# Patient Record
Sex: Male | Born: 1995 | Race: White | Hispanic: No | Marital: Single | Smoking: Never smoker
Health system: Southern US, Community
[De-identification: ages and names within clinical notes are randomized; demographics above are authoritative.]

---
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
## 2014-12-27 ENCOUNTER — Ambulatory Visit: Payer: BLUE CROSS/BLUE SHIELD | Admitting: Urology

## 2017-01-22 ENCOUNTER — Encounter: Payer: Self-pay | Admitting: Emergency Medicine

## 2017-01-22 ENCOUNTER — Emergency Department: Payer: BLUE CROSS/BLUE SHIELD

## 2017-01-22 ENCOUNTER — Emergency Department
Admission: EM | Admit: 2017-01-22 | Discharge: 2017-01-23 | Disposition: A | Discharge status: Home / Self Care | Payer: BLUE CROSS/BLUE SHIELD | Attending: Emergency Medicine | Admitting: Emergency Medicine

## 2017-01-22 DIAGNOSIS — R161 Splenomegaly, not elsewhere classified: Secondary | ICD-10-CM | POA: Insufficient documentation

## 2017-01-22 DIAGNOSIS — L03818 Cellulitis of other sites: Secondary | ICD-10-CM

## 2017-01-22 DIAGNOSIS — L02214 Cutaneous abscess of groin: Secondary | ICD-10-CM | POA: Diagnosis present

## 2017-01-22 DIAGNOSIS — L03314 Cellulitis of groin: Secondary | ICD-10-CM | POA: Insufficient documentation

## 2017-01-22 DIAGNOSIS — R509 Fever, unspecified: Secondary | ICD-10-CM | POA: Diagnosis not present

## 2017-01-22 DIAGNOSIS — N433 Hydrocele, unspecified: Secondary | ICD-10-CM | POA: Diagnosis not present

## 2017-01-22 DIAGNOSIS — L0291 Cutaneous abscess, unspecified: Secondary | ICD-10-CM

## 2017-01-22 LAB — CBC WITH DIFFERENTIAL/PLATELET
BASOS ABS: 0.1 10*3/uL (ref 0–0.1)
BASOS PCT: 1 %
EOS ABS: 0 10*3/uL (ref 0–0.7)
Eosinophils Relative: 1 %
HCT: 34.3 % — ABNORMAL LOW (ref 40.0–52.0)
HEMOGLOBIN: 11.8 g/dL — AB (ref 13.0–18.0)
Lymphocytes Relative: 10 %
Lymphs Abs: 0.9 10*3/uL — ABNORMAL LOW (ref 1.0–3.6)
MCH: 25.9 pg — ABNORMAL LOW (ref 26.0–34.0)
MCHC: 34.3 g/dL (ref 32.0–36.0)
MCV: 75.5 fL — ABNORMAL LOW (ref 80.0–100.0)
MONOS PCT: 9 %
Monocytes Absolute: 0.8 10*3/uL (ref 0.2–1.0)
Neutro Abs: 6.9 10*3/uL — ABNORMAL HIGH (ref 1.4–6.5)
Neutrophils Relative %: 79 %
Platelets: 201 10*3/uL (ref 150–440)
RBC: 4.55 MIL/uL (ref 4.40–5.90)
RDW: 14.9 % — ABNORMAL HIGH (ref 11.5–14.5)
WBC: 8.6 10*3/uL (ref 3.8–10.6)

## 2017-01-22 LAB — URINALYSIS, COMPLETE (UACMP) WITH MICROSCOPIC
BILIRUBIN URINE: NEGATIVE
Bacteria, UA: NONE SEEN
Glucose, UA: NEGATIVE mg/dL
Hgb urine dipstick: NEGATIVE
KETONES UR: 5 mg/dL — AB
LEUKOCYTES UA: NEGATIVE
NITRITE: NEGATIVE
PH: 5 (ref 5.0–8.0)
Protein, ur: NEGATIVE mg/dL
Specific Gravity, Urine: 1.045 — ABNORMAL HIGH (ref 1.005–1.030)
Squamous Epithelial / LPF: NONE SEEN

## 2017-01-22 LAB — BASIC METABOLIC PANEL
Anion gap: 10 (ref 5–15)
BUN: 12 mg/dL (ref 6–20)
CO2: 25 mmol/L (ref 22–32)
CREATININE: 0.65 mg/dL (ref 0.61–1.24)
Calcium: 8.6 mg/dL — ABNORMAL LOW (ref 8.9–10.3)
Chloride: 100 mmol/L — ABNORMAL LOW (ref 101–111)
GFR calc non Af Amer: >60 mL/min (ref >60)
Glucose, Bld: 117 mg/dL — ABNORMAL HIGH (ref 65–99)
Potassium: 3.7 mmol/L (ref 3.5–5.1)
SODIUM: 135 mmol/L (ref 135–145)

## 2017-01-22 LAB — LACTIC ACID, PLASMA: Lactic Acid, Venous: 1 mmol/L (ref 0.5–1.9)

## 2017-01-22 MED ORDER — ONDANSETRON HCL 4 MG/2ML IJ SOLN
4.0000 mg | Freq: Once | INTRAMUSCULAR | Status: AC
  Administered 2017-01-22: 4 mg via INTRAVENOUS
  Filled 2017-01-22: qty 2

## 2017-01-22 MED ORDER — CLINDAMYCIN PHOSPHATE 600 MG/50ML IV SOLN
600.0000 mg | Freq: Once | INTRAVENOUS | Status: AC
  Administered 2017-01-22: 600 mg via INTRAVENOUS
  Filled 2017-01-22: qty 50

## 2017-01-22 MED ORDER — OXYCODONE-ACETAMINOPHEN 5-325 MG PO TABS: 1.0000 | ORAL_TABLET | Freq: Four times a day (QID) | ORAL | 0 refills | Status: DC | PRN

## 2017-01-22 MED ORDER — SODIUM CHLORIDE 0.9 % IV BOLUS (SEPSIS)
1000.0000 mL | Freq: Once | INTRAVENOUS | Status: AC
  Administered 2017-01-22: 1000 mL via INTRAVENOUS

## 2017-01-22 MED ORDER — IOPAMIDOL (ISOVUE-300) INJECTION 61%
125.0000 mL | Freq: Once | INTRAVENOUS | Status: AC | PRN
  Administered 2017-01-22: 125 mL via INTRAVENOUS
  Filled 2017-01-22: qty 150

## 2017-01-22 MED ORDER — AMOXICILLIN-POT CLAVULANATE 875-125 MG PO TABS: 1.0000 | ORAL_TABLET | Freq: Two times a day (BID) | ORAL | 0 refills | Status: DC

## 2017-01-22 MED ORDER — MORPHINE SULFATE (PF) 2 MG/ML IV SOLN
2.0000 mg | INTRAVENOUS | Status: DC | PRN
  Administered 2017-01-22 (×2): 2 mg via INTRAVENOUS
  Filled 2017-01-22 (×2): qty 1

## 2017-01-22 NOTE — ED Notes (Signed)
Attempted IV insertion. Unsuccessful. Family states pt has always been a hard stick since childhood.

## 2017-01-22 NOTE — ED Triage Notes (Signed)
Patient presents to the ED with abscess to groin area since Sunday.  Patient reports fever at home that is relieved with tylenol.  Patient is in no obvious distress at this time.  Patient also has wound to middle finger of left hand.  Patient states area was a similar abscess that he drained.

## 2017-01-22 NOTE — ED Notes (Signed)
Pt has pubic swelling, greater on L. Scrotal swelling as well. Tender to touch. Pt denies having difficulty urinating. Pt states pubic swelling began Saturday and swelling has progressed downward and increased in size.

## 2017-01-22 NOTE — ED Notes (Signed)
Pt taken to CT via stretcher, family remains at bedside.  

## 2017-01-22 NOTE — Discharge Instructions (Signed)
Your exam, labs, and CT scan revealed a small, developing abscess. You also have some benign, reactive swelling in the scrotum (hydrocele). You should take the antibiotic as directed. Take the pain medicine as needed. Rest with the legs elevated, and your scrotum propped up on a rolled towel. Call Dr. Jasmine AweHerrick's office in the morning to confirm a time, and the office location. Return to the ED immediately for any worsening symptoms in the interim.

## 2017-01-22 NOTE — ED Provider Notes (Signed)
[facility] Emergency Department Provider Note ____________________________________________  Time seen: 1939  I have reviewed the triage vital signs and the nursing notes.  HISTORY  Chief Complaint  Abscess  History as reported to M. Whitehurst, PA-S ([place])  HPI Greg Valdez is a 21 y.o. male presents to the ED for evaluation of multiple local abscess to the dorsal fingers; but primarily a large abscess to the left groin and scrotum. He noticed it on Sunday, four days prior to arrival. He gives a history of recurrent abscess to the skin; and give a 2-year history of a scrotal abscess requiring surgical incision and drainage. He notes intermittent, subjective fevers and anorexia today. He reports a couple episodes of vomiting today. His mom admits to giving him 6 doses of Bactrim at onset.   History reviewed. No pertinent past medical history.  There are no active problems to display for this patient.  History reviewed. No pertinent surgical history.  Prior to Admission medications   Medication Sig Start Date End Date Taking? Authorizing Provider  amoxicillin-clavulanate (AUGMENTIN) 875-125 MG tablet Take 1 tablet by mouth 2 (two) times daily. 01/22/17   Fruma Africa, Charlesetta IvoryJenise V Bacon, PA-C  oxyCODONE-acetaminophen (ROXICET) 5-325 MG tablet Take 1 tablet by mouth every 6 (six) hours as needed for moderate pain or severe pain. 01/22/17   Dimitra Woodstock, Charlesetta IvoryJenise V Bacon, PA-C    Allergies Patient has no known allergies.  No family history on file.  Social History Social History  Substance Use Topics  . Smoking status: Never Smoker  . Smokeless tobacco: Never Used  . Alcohol use Yes     Comment: occasionally    Review of Systems  Constitutional: Positive for fever. Cardiovascular: Negative for chest pain. Respiratory: Negative for shortness of breath. Gastrointestinal: Negative for abdominal pain and diarrhea. Reports anorexia and vomiting today. Genitourinary:  Negative for dysuria. Scrotal abscess as above. Skin: Negative for rash. Finger absceses as above. Neurological: Negative for headaches, focal weakness or numbness. ____________________________________________  PHYSICAL EXAM:  VITAL SIGNS: ED Triage Vitals [01/22/17 1832]  Enc Vitals Group     BP 124/67     Pulse Rate 98     Resp 18     Temp 99.9 F (37.7 C)     Temp Source Oral     SpO2 100 %     Weight 230 lb (104.3 kg)     Height 5\' 5"  (1.651 m)     Head Circumference      Peak Flow      Pain Score 10     Pain Loc      Pain Edu?      Excl. in GC?     Constitutional: Alert and oriented. Well appearing and in no distress. Head: Normocephalic and atraumatic. Eyes: Conjunctivae are normal. Normal extraocular movements Cardiovascular: Normal rate, regular rhythm. Normal distal pulses. Respiratory: Normal respiratory effort. No wheezes/rales/rhonchi. Gastrointestinal: Soft and nontender. No distention. GU: Normal circumcised penis. Large bilateral, symmetric edema of the scrotum. Firmness, induration and erythema extending from the left inguinal canal, across the pubis, to the right side of the pubis. There was a single, superficial ulceration, measuring a 0.3 cm, without pointing, fluctuance, or spontaneous/expressive drainage. No penile discharge.   Musculoskeletal: Nontender with normal range of motion in all extremities.  Neurologic:  Normal speech and language. No gross focal neurologic deficits are appreciated. Skin:  Skin is warm, dry and intact. No rash noted. ____________________________________________   LABS (pertinent positives/negatives)  Labs Reviewed  CBC WITH DIFFERENTIAL/PLATELET - Abnormal; Notable for the following:       Result Value   Hemoglobin 11.8 (*)    HCT 34.3 (*)    MCV 75.5 (*)    MCH 25.9 (*)    RDW 14.9 (*)    Neutro Abs 6.9 (*)    Lymphs Abs 0.9 (*)    All other components within normal limits  BASIC METABOLIC PANEL - Abnormal; Notable  for the following:    Chloride 100 (*)    Glucose, Bld 117 (*)    Calcium 8.6 (*)    All other components within normal limits  URINALYSIS, COMPLETE (UACMP) WITH MICROSCOPIC - Abnormal; Notable for the following:    Color, Urine YELLOW (*)    APPearance CLEAR (*)    Specific Gravity, Urine 1.045 (*)    Ketones, ur 5 (*)    All other components within normal limits  LACTIC ACID, PLASMA  ____________________________________________   RADIOLOGY  CTAbd/Pelvis  IMPRESSION: 1. Large bilateral scrotal hydroceles. Marked skin thickening and extensive edema and inflammatory process involving the inter upper left thigh, left scrotal region, and left greater than right groin suspect for cellulitis. More focal density in the left inguinal/upper scrotal region measuring 4.2 cm may represent flight min (phlegmon) or developing abscess. No evidence for soft tissue emphysema. 2. Multiple enlarged inguinal and iliac lymph nodes, these are likely reactive. 3. Enlarged spleen 4. Possible patchy hypoenhancement of the right greater than left renal cortex, suggest correlation with urinalysis to exclude pyelonephritis. ____________________________________________  PROCEDURES  NS 1000 bolus IVP Morphine 2 mg IVP x 2 Zofran 4 mg IVP Clindamycin 600 mg IVPB ____________________________________________  INITIAL IMPRESSION / ASSESSMENT AND PLAN / ED COURSE  ----------------------------------------- 11:00 PM on 01/22/2017 ----------------------------------------- Spoke with Dr. Marlou Porch (urology) about the case. Reviewed presentation, labs, CT and clinical response. He suggested outpatient management and close office follow-up in the morning. He also would be available to consult if patient seemed more inclined to come in for IV Abx and pain control.   Discussed treatment plan options with the patient and his parents, with his consent. He decided to follow-up with urology in the morning. He will  take Augmentin and Percocet for pain relief. He verbalized understanding of the treatment plan, and return precautions.  ____________________________________________  FINAL CLINICAL IMPRESSION(S) / ED DIAGNOSES  Final diagnoses:  Abscess  Hydrocele, bilateral  Cellulitis of other specified site      Lissa Hoard, PA-C 01/23/17 0011    Rockne Menghini, MD 01/27/17 2137

## 2017-01-22 NOTE — ED Notes (Signed)
Pt given urinal and informed of need for urine sample. States he doesn't have to urinate right now.

## 2017-01-23 ENCOUNTER — Ambulatory Visit (INDEPENDENT_AMBULATORY_CARE_PROVIDER_SITE_OTHER): Payer: BLUE CROSS/BLUE SHIELD | Admitting: Urology

## 2017-01-23 ENCOUNTER — Encounter: Payer: Self-pay | Admitting: Urology

## 2017-01-23 VITALS — BP 107/67 | HR 93 | Ht 65.0 in | Wt 235.6 lb

## 2017-01-23 DIAGNOSIS — N492 Inflammatory disorders of scrotum: Secondary | ICD-10-CM

## 2017-01-23 DIAGNOSIS — L03314 Cellulitis of groin: Secondary | ICD-10-CM | POA: Diagnosis not present

## 2017-01-23 MED ORDER — ACETAMINOPHEN 500 MG PO TABS
1000.0000 mg | ORAL_TABLET | Freq: Once | ORAL | Status: AC
  Administered 2017-01-23: 1000 mg via ORAL
  Filled 2017-01-23: qty 2

## 2017-01-23 MED ORDER — CLINDAMYCIN HCL 300 MG PO CAPS: 600.0000 mg | ORAL_CAPSULE | Freq: Three times a day (TID) | ORAL | 0 refills | Status: DC

## 2017-01-23 NOTE — ED Notes (Signed)

## 2017-01-23 NOTE — Progress Notes (Signed)
01/23/2017 2:13 PM   Greg Valdez 12/14/1995 [id number]  Referring provider: [facility] [address] [place], [place] [id number]  CC: Scrotal cellulitis  HPI: The patient is a 21 year old gentleman with history of scrotal abscess requiring incision and drainage who presents today for ER follow-up after being seen last night for groin swelling.  He was seen in the ER for this last night. This started approximately 5 days ago. Is gotten increasingly worse. He has had bilateral scrotal swelling and pain. It's been worse on the left. There is gotten increasingly worse. He has had fevers greater than 101.  He has noted no drainage from this area. He was seen in the emergency department for this last night. The CT scan did show large bilateral hydroceles as well as marked skin thickening and extensive edema and inflammatory process involving the inner upper thigh, left scrotal region, and left greater than right groin suspect for cellulitis. There is a focal density in the left inguinal/scrotal region measuring 4.2 cm which may be indicated in early abscess. He had no definable abscess at that time. He was given a dose of clindamycin and discharged home on Augmentin. He has been afebrile today.   PMH: History reviewed. No pertinent past medical history.  Surgical History: History reviewed. No pertinent surgical history.  Home Medications:  Allergies as of 01/23/2017   No Known Allergies     Medication List       Accurate as of 01/23/17  2:13 PM. Always use your most recent med list.          amoxicillin-clavulanate 875-125 MG tablet Commonly known as:  AUGMENTIN Take 1 tablet by mouth 2 (two) times daily.   clindamycin 300 MG capsule Commonly known as:  CLEOCIN Take 2 capsules (600 mg total) by mouth 3 (three) times daily.   oxyCODONE-acetaminophen 5-325 MG tablet Commonly known as:  ROXICET Take 1 tablet by mouth every 6 (six) hours as needed for moderate  pain or severe pain.            Discharge Care Instructions        Start     Ordered   01/23/17 0000  clindamycin (CLEOCIN) 300 MG capsule  3 times daily     01/23/17 1346      Allergies: No Known Allergies  Family History: Family History  Problem Relation Age of Onset  . Prostate cancer Neg Hx   . Bladder Cancer Neg Hx   . Kidney cancer Neg Hx     Social History:  reports that he has never smoked. He has never used smokeless tobacco. He reports that he drinks alcohol. His drug history is not on file.  ROS: UROLOGY Frequent Urination?: No Hard to postpone urination?: No Burning/pain with urination?: No Get up at night to urinate?: Yes Leakage of urine?: No Urine stream starts and stops?: No Trouble starting stream?: Yes Do you have to strain to urinate?: No Blood in urine?: No Urinary tract infection?: No Sexually transmitted disease?: No Injury to kidneys or bladder?: No Painful intercourse?: No Weak stream?: No Erection problems?: No Penile pain?: No  Gastrointestinal Nausea?: Yes Vomiting?: Yes Indigestion/heartburn?: No Diarrhea?: No Constipation?: No  Constitutional Fever: Yes Night sweats?: No Weight loss?: No Fatigue?: No  Skin Skin rash/lesions?: Yes Itching?: No  Eyes Blurred vision?: No Double vision?: No  Ears/Nose/Throat Sore throat?: No Sinus problems?: No  Hematologic/Lymphatic Swollen glands?: No Easy bruising?: No  Cardiovascular Leg swelling?: No  Chest pain?: No  Respiratory Cough?: No Shortness of breath?: No  Endocrine Excessive thirst?: No  Musculoskeletal Back pain?: No Joint pain?: No  Neurological Headaches?: No Dizziness?: No  Psychologic Depression?: No Anxiety?: No  Physical Exam: BP 107/67 (BP Location: Left Arm, Patient Position: Sitting, Cuff Size: Normal)   Pulse 93   Ht  (1.651 m)   Wt 235 lb 9.6 oz (106.9 kg)   BMI 39.21 kg/m   Constitutional:  Alert and oriented, No acute  distress. HEENT: [place] AT, moist mucus membranes.  Trachea midline, no masses. Cardiovascular: No clubbing, cyanosis, or edema. Respiratory: Normal respiratory effort, no increased work of breathing. GI: Abdomen is soft, nontender, nondistended, no abdominal masses GU: No CVA tenderness. Normal phallus. Diffuse bilateral scrotal edema with hydroceles. He is also edematous within his left greater than right inguinal canals. There is no areas of drainage. In the left upper scrotum there is an approximate 2 cm area of fluctuance that is not draining. There is no crepitus. There are no other areas of fluctuance. Skin: No rashes, bruises or suspicious lesions. Lymph: No cervical or inguinal adenopathy. Neurologic: Grossly intact, no focal deficits, moving all 4 extremities. Psychiatric: Normal mood and affect.  Laboratory Data: Lab Results  Component Value Date   WBC 8.6 01/22/2017   HGB 11.8 (L) 01/22/2017   HCT 34.3 (L) 01/22/2017   MCV 75.5 (L) 01/22/2017   PLT 201 01/22/2017    Lab Results  Component Value Date   CREATININE 0.65 01/22/2017    No results found for: PSA  No results found for: TESTOSTERONE  No results found for: HGBA1C  Urinalysis    Component Value Date/Time   COLORURINE YELLOW (A) 01/22/2017 2244   APPEARANCEUR CLEAR (A) 01/22/2017 2244   LABSPEC 1.045 (H) 01/22/2017 2244   PHURINE 5.0 01/22/2017 2244   GLUCOSEU NEGATIVE 01/22/2017 2244   HGBUR NEGATIVE 01/22/2017 2244   BILIRUBINUR NEGATIVE 01/22/2017 2244   KETONESUR 5 (A) 01/22/2017 2244   PROTEINUR NEGATIVE 01/22/2017 2244   NITRITE NEGATIVE 01/22/2017 2244   LEUKOCYTESUR NEGATIVE 01/22/2017 2244    Pertinent Imaging: CT images personally reviewed as above  Procedure: The patient was prepped and draped in the usual sterile fashion. 8 cc of lidocaine was injected without epinephrine into the fluctuant area in the left lateral upper scrotum. A 2 cm incision was made. There is immediate drainage of  purulent discharge which was sent for cultures. All purulent material was expressed. The wound was probed to break up any loculations until there was no more purulent drainage. The wound was then packed with iodoform and gauze.  Assessment & Plan:    1. Scrotal abscess 2. Scrotal cellulitis We will add clindamycin to the patient's antibiotic regimen. Will follow-up in the patient's wound cultures. His mother is a nurse has been instructed to remove approximately 5 cm of the iodoform gauze daily. He will follow-up on Monday in our office for a wound check. He has mother given strict return precautions if his condition worsens over the weekend.  Return in 3 days (on 01/26/2017).  Hildred Laser, MD  [facility] [address], [address] [place], [place] [address] [phone]

## 2017-01-23 NOTE — Addendum Note (Signed)
Addended by: Honor LohGARRISON, CARRIE M on: 01/23/2017 02:28 PM   Modules accepted: Orders

## 2017-01-26 ENCOUNTER — Ambulatory Visit (INDEPENDENT_AMBULATORY_CARE_PROVIDER_SITE_OTHER): Payer: BLUE CROSS/BLUE SHIELD | Admitting: Urology

## 2017-01-26 ENCOUNTER — Encounter: Payer: Self-pay | Admitting: Urology

## 2017-01-26 VITALS — BP 107/70 | HR 72 | Ht 65.0 in | Wt 232.0 lb

## 2017-01-26 DIAGNOSIS — N492 Inflammatory disorders of scrotum: Secondary | ICD-10-CM

## 2017-01-26 MED ORDER — OXYCODONE-ACETAMINOPHEN 5-325 MG PO TABS: 1.0000 | ORAL_TABLET | Freq: Four times a day (QID) | ORAL | 0 refills | Status: DC | PRN

## 2017-01-26 NOTE — Progress Notes (Signed)
The patient is seen today for follow-up of a scrotal abscess. Over the past week his mother has been slowly removing the packing and cutting off a little bit more each day. He denies any ongoing fevers. Has not had any voiding symptoms. His pain is improving, but he still has some tenderness around the area of his incision. His scrotal edema has improved significantly. However, he continues to have some swelling and induration in that area.  Current Outpatient Prescriptions on File Prior to Visit  Medication Sig Dispense Refill  . amoxicillin-clavulanate (AUGMENTIN) 875-125 MG tablet Take 1 tablet by mouth 2 (two) times daily. 20 tablet 0  . clindamycin (CLEOCIN) 300 MG capsule Take 2 capsules (600 mg total) by mouth 3 (three) times daily. 60 capsule 0   No current facility-administered medications on file prior to visit.    History reviewed. No pertinent past medical history. Vitals:   01/26/17 1524  BP: 107/70  Pulse: 72  Weight: 105.2 kg (232 lb)  Height: 5\' 5"  (1.651 m)   NAD 1cm Opening in the left hemiscrotum on the dependent area with packing emanating up and to the left. There is no tenderness around the area of the incision or in the groin. There is no erythema. There continues to be some scrotal edema.  Procedure: The packing that was previously placed was removed. I then took a pickup and removed several large clots of fibrinous debris. I then washed the area out again with saline and gauze. I then repacked the area with 2 x 2 Kerlix dressing.  Impression/recommendation: The patient has a left hemiscrotal/inguinal abscess has been drained and is healing well. The patient will continue his antibiotics until his completed them. He should now be changing his dressing once daily. We'll plan to have him follow up in one week for reevaluation/wound check.

## 2017-01-29 LAB — WOUND CULTURE

## 2017-01-30 ENCOUNTER — Telehealth: Payer: Self-pay

## 2017-01-30 MED ORDER — SULFAMETHOXAZOLE-TRIMETHOPRIM 800-160 MG PO TABS: 1.0000 | ORAL_TABLET | Freq: Two times a day (BID) | ORAL | 0 refills | Status: DC

## 2017-01-30 NOTE — Telephone Encounter (Signed)
Patient notified on vmail, abx sent

## 2017-01-30 NOTE — Telephone Encounter (Signed)
-----   Message from Hildred Laser, MD sent at 01/29/2017  6:41 PM EDT ----- Please let patient/mom know that his wound culture finally came back. He has MRSA growing (Mother is a Engineer, civil (consulting), so she will know what this means). It is recent to clindamycin which I started him on.  Fortunately, draining the abscess was the most important thing to help him. He should stop the abx he is on and start 1 tab PO Bactrim DS BID for 10 days to clean up an residual infection. Thanks, BB   ----- Message ----- From: Lissa Hoard, CMA Sent: 01/29/2017  11:42 AM To: Hildred Laser, MD    ----- Message ----- From: Interface, [facility] Lab Results In Sent: 01/25/2017   7:38 AM To: Jennette Kettle Clinical

## 2017-02-02 ENCOUNTER — Ambulatory Visit: Payer: BLUE CROSS/BLUE SHIELD | Admitting: Urology

## 2017-02-02 ENCOUNTER — Encounter: Payer: Self-pay | Admitting: Urology

## 2017-02-02 VITALS — BP 116/75 | HR 64 | Ht 65.0 in | Wt 231.9 lb

## 2017-02-02 DIAGNOSIS — N492 Inflammatory disorders of scrotum: Secondary | ICD-10-CM | POA: Insufficient documentation

## 2017-02-02 NOTE — Progress Notes (Unsigned)
02/02/2017 8:31 AM   Greg Valdez Sep 22, 1995 [id number]  Referring provider: [facility] [address] [place], [place] [address]  CC: Fu scrotal abscess  HPI:  1 - Scrotal Abscess - s/p I+D in ofice by  [facility] 01/23/17 for 4cm abscess. CX MRSA res to clinda and placed on Bactrim. Improving at wound check 9/10 and continue packing. Wound check 9/17 very mild induration, completely resolved fluid collection.   Today "Greg Valdez" is seen for wound check for scrotal abscess. He is on Bactirm now according to CX's.    PMH: No past medical history on file.  Surgical History: No past surgical history on file.  Home Medications:  Allergies as of 02/02/2017   No Known Allergies     Medication List       Accurate as of 02/02/17  8:31 AM. Always use your most recent med list.          amoxicillin-clavulanate 875-125 MG tablet Commonly known as:  AUGMENTIN Take 1 tablet by mouth 2 (two) times daily.   clindamycin 300 MG capsule Commonly known as:  CLEOCIN Take 2 capsules (600 mg total) by mouth 3 (three) times daily.   oxyCODONE-acetaminophen 5-325 MG tablet Commonly known as:  ROXICET Take 1 tablet by mouth every 6 (six) hours as needed for moderate pain or severe pain.   sulfamethoxazole-trimethoprim 800-160 MG tablet Commonly known as:  BACTRIM DS,SEPTRA DS Take 1 tablet by mouth every 12 (twelve) hours.       Allergies: No Known Allergies  Family History: Family History  Problem Relation Age of Onset  . Prostate cancer Neg Hx   . Bladder Cancer Neg Hx   . Kidney cancer Neg Hx     Social History:  reports that he has never smoked. He has never used smokeless tobacco. He reports that he drinks alcohol. His drug history is not on file.    Review of Systems  Gastrointestinal (upper)  : Negative for upper GI symptoms  Gastrointestinal (lower) : Negative for lower GI symptoms  Constitutional : Negative for symptoms  Skin: Negative  for skin symptoms  Eyes: Negative for eye symptoms  Ear/Nose/Throat : Negative for Ear/Nose/Throat symptoms  Hematologic/Lymphatic: Negative for Hematologic/Lymphatic symptoms  Cardiovascular : Negative for cardiovascular symptoms  Respiratory : Negative for respiratory symptoms  Endocrine: Negative for endocrine symptoms  Musculoskeletal: Negative for musculoskeletal symptoms  Neurological: Negative for neurological symptoms  Psychologic: Negative for psychiatric symptoms   Physical Exam: There were no vitals taken for this visit.  Constitutional:  Alert and oriented, No acute distress. HEENT: [place] AT, moist mucus membranes.  Trachea midline, no masses. Cardiovascular: No clubbing, cyanosis, or edema. Respiratory: Normal respiratory effort, no increased work of breathing. GI: Abdomen is soft, nontender, nondistended, no abdominal masses GU: No CVA tenderness. Left groin / upper scrotal mild induration w/o erythema / fluctence. No drainage.  Skin: No rashes, bruises or suspicious lesions. Lymph: No cervical or inguinal adenopathy. Neurologic: Grossly intact, no focal deficits, moving all 4 extremities. Psychiatric: Normal mood and affect.  Laboratory Data: Lab Results  Component Value Date   WBC 8.6 01/22/2017   HGB 11.8 (L) 01/22/2017   HCT 34.3 (L) 01/22/2017   MCV 75.5 (L) 01/22/2017   PLT 201 01/22/2017    Lab Results  Component Value Date   CREATININE 0.65 01/22/2017    No results found for: PSA  No results found for: TESTOSTERONE  No results found for: HGBA1C  Urinalysis  Component Value Date/Time   COLORURINE YELLOW (A) 01/22/2017 2244   APPEARANCEUR CLEAR (A) 01/22/2017 2244   LABSPEC 1.045 (H) 01/22/2017 2244   PHURINE 5.0 01/22/2017 2244   GLUCOSEU NEGATIVE 01/22/2017 2244   HGBUR NEGATIVE 01/22/2017 2244   BILIRUBINUR NEGATIVE 01/22/2017 2244   KETONESUR 5 (A) 01/22/2017 2244   PROTEINUR NEGATIVE 01/22/2017 2244   NITRITE NEGATIVE  01/22/2017 2244   LEUKOCYTESUR NEGATIVE 01/22/2017 2244     Assessment & Plan:    1. Scrotal abscess - resolving clinically, nearly completely so. I do not feel further need for any wound packing. Continue bactrim as RX'd, then topical neosporin PRN until completely mucosalized.   Sebastian Ache, MD  [facility] [address] [place], [place] [phone]

## 2019-02-01 IMAGING — CT CT ABD-PELV W/ CM
2 of 4 series · 15 of 46 positions shown, 17 images · IV contrast (APPLIED)
Comparison: 12/20/2005

CLINICAL DATA: Abscess to the groin area with fever

EXAM:
CT ABDOMEN AND PELVIS WITH CONTRAST
TECHNIQUE: Multidetector CT imaging of the abdomen and pelvis was performed
using the standard protocol following bolus administration of
intravenous contrast.
CONTRAST:  125mL [id number] IOPAMIDOL ([id number]) INJECTION 61%

[Series 2: routine abd/pel with · axial · 0.91mm/px · z∈[-733,-183]mm · 12 of 120 slices shown, 14 images]
[im 5/120  soft-tissue]
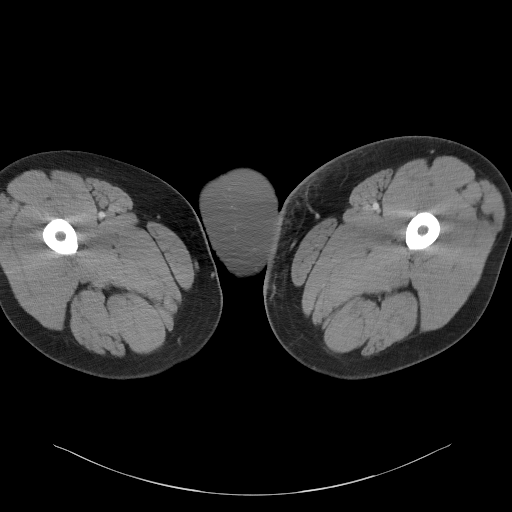
[im 5/120  bone]
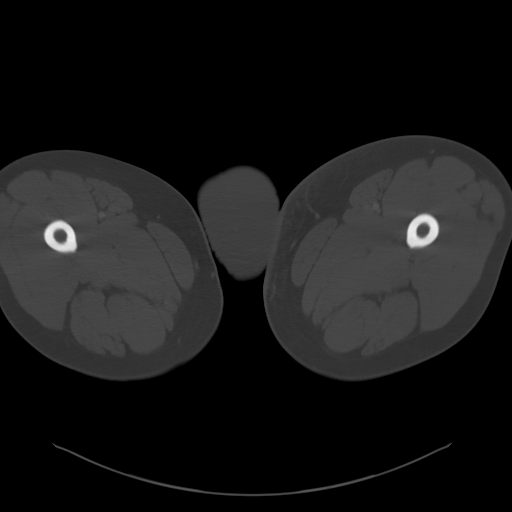
[im 15/120  soft-tissue]
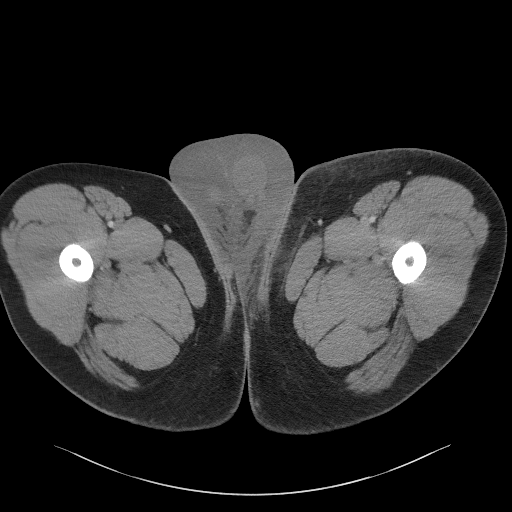
[im 25/120  soft-tissue]
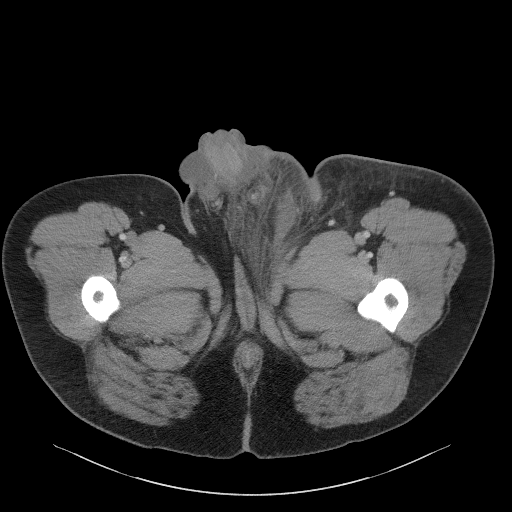
[im 35/120  soft-tissue]
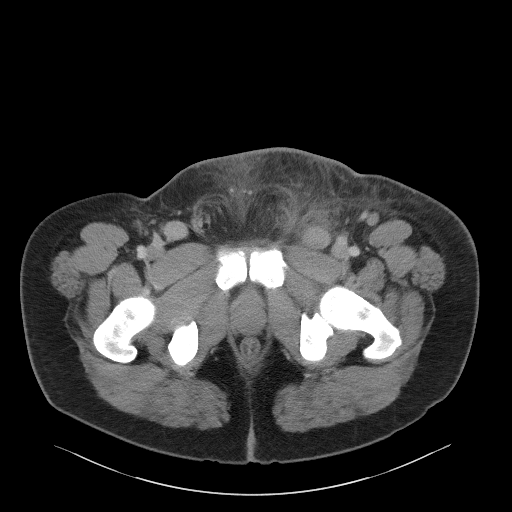
[im 45/120  soft-tissue]
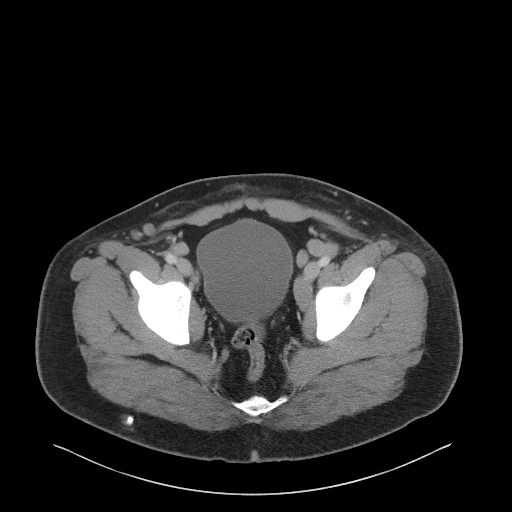
[im 55/120  soft-tissue]
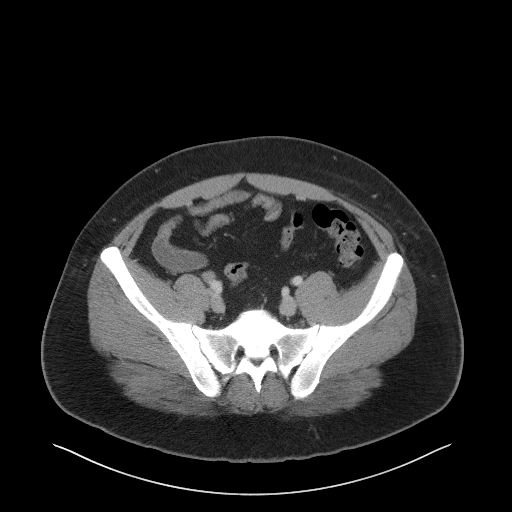
[im 65/120  soft-tissue]
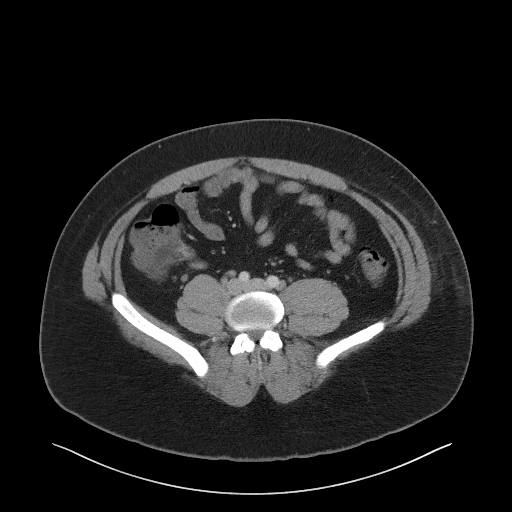
[im 75/120  soft-tissue]
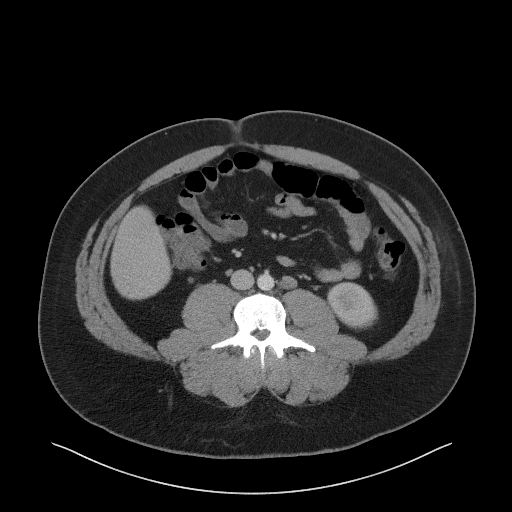
[im 85/120  soft-tissue]
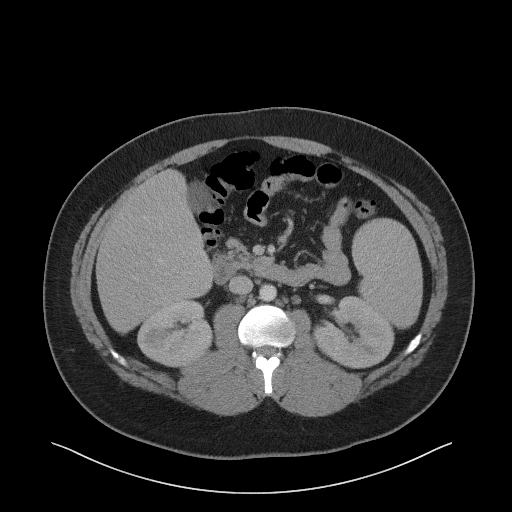
[im 85/120  bone]
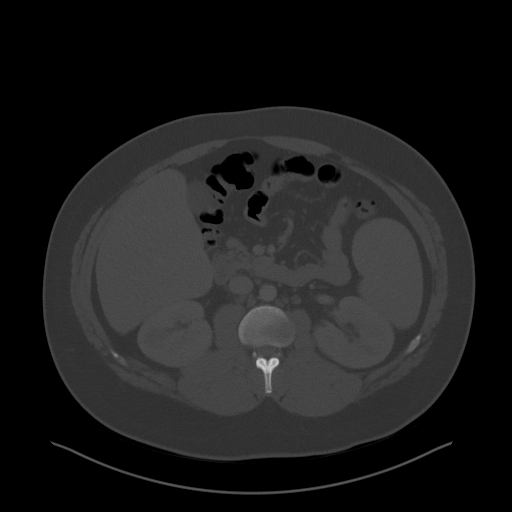
[im 95/120  soft-tissue]
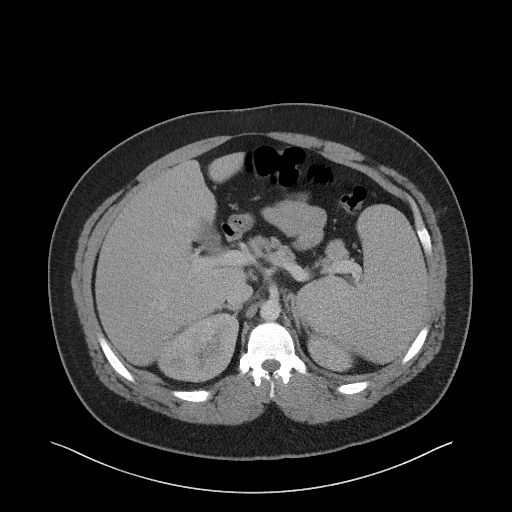
[im 105/120  soft-tissue]
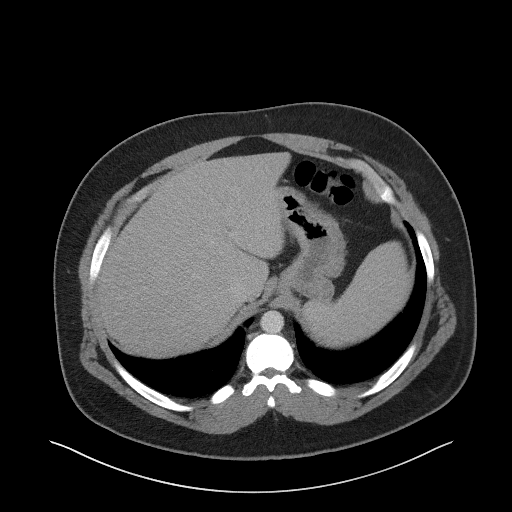
[im 115/120  soft-tissue]
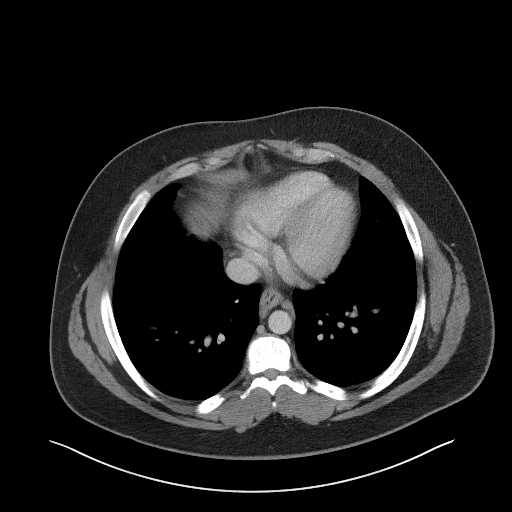

[Series 5: coronal st · coronal · 0.81mm/px · 3 of 102 slices shown]
[im 34/102  soft-tissue]
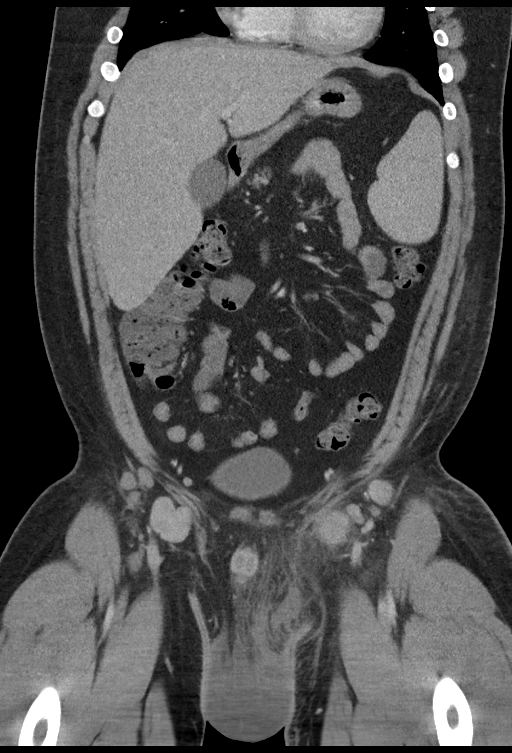
[im 45/102  soft-tissue]
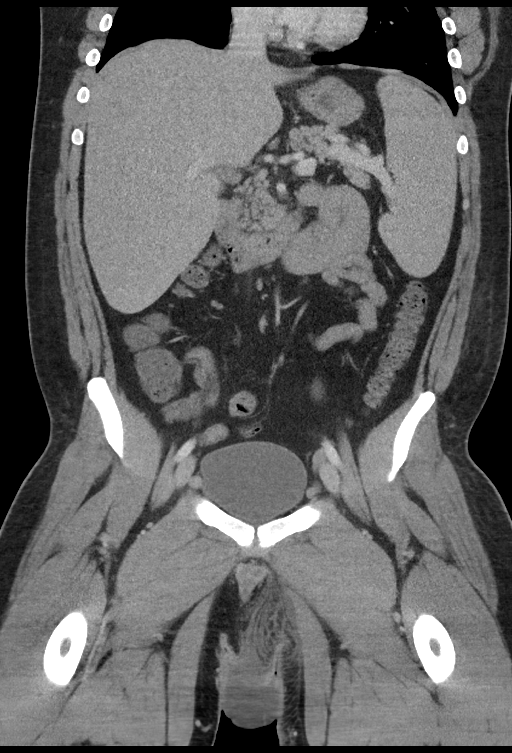
[im 57/102  soft-tissue]
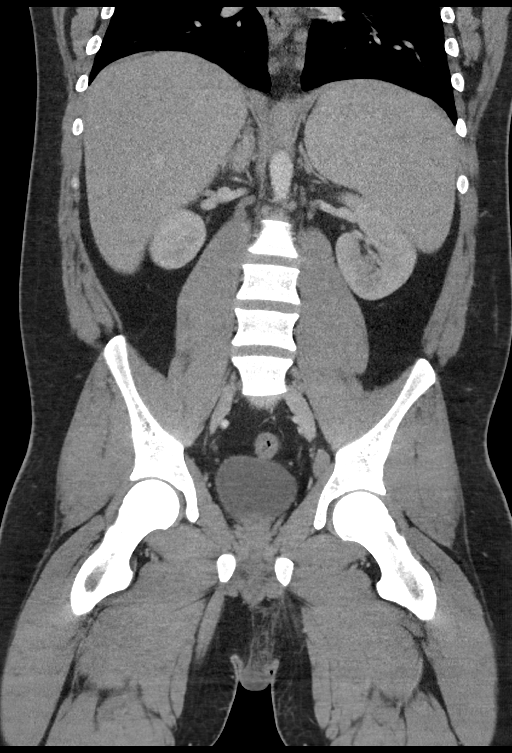

[15 of 46 positions shown; findings below may reference images not displayed]

FINDINGS: Lower chest: No acute abnormality.

Hepatobiliary: No focal liver abnormality is seen. No gallstones,
gallbladder wall thickening, or biliary dilatation.

Pancreas: Unremarkable. No pancreatic ductal dilatation or
surrounding inflammatory changes.

Spleen: Slightly enlarged, measuring 15.4 cm.

Adrenals/Urinary Tract: Adrenal glands are within normal limits.
Negative for hydronephrosis. Questionable patchy hypodensities
within the right greater than left kidneys. Bladder normal

Stomach/Bowel: Stomach is within normal limits. Appendix appears
normal. No evidence of bowel wall thickening, distention, or
inflammatory changes.

Vascular/Lymphatic: Non aneurysmal aorta. Enlarged left iliac nodes,
measuring up to 11 mm. Multiple enlarged left greater than right
inguinal and external iliac lymph nodes.

Reproductive: Prostate unremarkable.  Large hydroceles.

Other: Negative for free air or free fluid. Skin thickening and
marked edema within the proximal left thigh, left scrotal region and
left groin suspect for a cellulitis. Focal heterogenous density
measuring 4.2 x 1.8 cm within the left inguinal /upper scrotal
region. No soft tissue gas is present.

Musculoskeletal: No acute or suspicious bone lesion.
IMPRESSION: 1. Large bilateral scrotal hydroceles. Marked skin thickening and
extensive edema and inflammatory process involving the inter upper
left thigh, left scrotal region, and left greater than right groin
suspect for cellulitis. More focal density in the left
inguinal/upper scrotal region measuring 4.2 cm may represent flight
min or developing abscess. No evidence for soft tissue emphysema.
2. Multiple enlarged inguinal and iliac lymph nodes, these are
likely reactive.
3. Enlarged spleen
4. Possible patchy hypoenhancement of the right greater than left
renal cortex, suggest correlation with urinalysis to exclude
pyelonephritis.

## 2024-05-16 ENCOUNTER — Encounter: Payer: Self-pay | Admitting: Internal Medicine

## 2024-05-16 ENCOUNTER — Ambulatory Visit: Admitting: Internal Medicine

## 2024-05-16 VITALS — BP 118/74 | HR 66 | Temp 98.7°F | Ht 65.0 in | Wt 202.4 lb

## 2024-05-16 DIAGNOSIS — E6609 Other obesity due to excess calories: Secondary | ICD-10-CM | POA: Insufficient documentation

## 2024-05-16 DIAGNOSIS — E66811 Obesity, class 1: Secondary | ICD-10-CM

## 2024-05-16 DIAGNOSIS — D801 Nonfamilial hypogammaglobulinemia: Secondary | ICD-10-CM | POA: Insufficient documentation

## 2024-05-16 DIAGNOSIS — Q02 Microcephaly: Secondary | ICD-10-CM

## 2024-05-16 DIAGNOSIS — J00 Acute nasopharyngitis [common cold]: Secondary | ICD-10-CM

## 2024-05-16 DIAGNOSIS — J029 Acute pharyngitis, unspecified: Secondary | ICD-10-CM

## 2024-05-16 DIAGNOSIS — Z6833 Body mass index (BMI) 33.0-33.9, adult: Secondary | ICD-10-CM

## 2024-05-16 DIAGNOSIS — L309 Dermatitis, unspecified: Secondary | ICD-10-CM

## 2024-05-16 MED ORDER — PREDNISONE 10 MG PO TABS: ORAL_TABLET | ORAL | 0 refills | Status: AC

## 2024-05-16 MED ORDER — IMMUNE GLOBULIN (HUMAN) 1 GM/10ML IJ SOLN: 15.0000 g | INTRAMUSCULAR | 0 refills | Status: AC

## 2024-05-16 MED ORDER — BENZONATATE 100 MG PO CAPS: 100.0000 mg | ORAL_CAPSULE | Freq: Three times a day (TID) | ORAL | 0 refills | Status: AC | PRN

## 2024-05-16 MED ORDER — AZITHROMYCIN 250 MG PO TABS: ORAL_TABLET | ORAL | 0 refills | Status: AC

## 2024-05-16 NOTE — Assessment & Plan Note (Signed)
 Encouraged diet and exercise for weight loss ?

## 2024-05-16 NOTE — Patient Instructions (Signed)

## 2024-05-16 NOTE — Progress Notes (Signed)
 "  Subjective:    Patient ID: Greg Valdez, male    DOB: 07-21-1995, 28 y.o.   MRN: [id number]  HPI  Patient presents to the clinic today to establish care and for management of the conditions listed below.  Agammaglobulinemia: Managed with gammagard weekly. He follows with allergy and would like a referral in this area.  He has been experiencing symptoms for about a week, starting halfway through last Monday. Initially, he felt better by Wednesday but then worsened by Wednesday night. He has cold chills, diarrhea, and vomiting, although the vomiting has resolved. Diarrhea persists.  He visited urgent care last Wednesday where he was tested for flu A, flu B, COVID, and RSV, all of which were negative. He was prescribed an albuterol inhaler, which has not been effective. He has also been taking Mucinex.  He reports intermittent chills, slight headaches, sinus pressure, runny nose, nasal congestion, and yellow mucus when blowing his nose. No ear pain but a slight sore throat. He experiences shortness of breath and coughs up yellow mucus.  No recent fever. He attempted to return to work but was advised against it due to his symptoms.   Review of Systems   No past medical history on file.  Current Outpatient Medications  Medication Sig Dispense Refill   amoxicillin -clavulanate (AUGMENTIN ) 875-125 MG tablet Take 1 tablet by mouth 2 (two) times daily. (Patient not taking: Reported on 02/02/2017) 20 tablet 0   clindamycin  (CLEOCIN ) 300 MG capsule Take 2 capsules (600 mg total) by mouth 3 (three) times daily. (Patient not taking: Reported on 02/02/2017) 60 capsule 0   oxyCODONE -acetaminophen  (ROXICET) 5-325 MG tablet Take 1 tablet by mouth every 6 (six) hours as needed for moderate pain or severe pain. (Patient not taking: Reported on 02/02/2017) 20 tablet 0   sulfamethoxazole -trimethoprim  (BACTRIM  DS,SEPTRA  DS) 800-160 MG tablet Take 1 tablet by mouth every 12 (twelve) hours. 14 tablet 0   No  current facility-administered medications for this visit.    Allergies[1]  Family History  Problem Relation Age of Onset   Prostate cancer Neg Hx    Bladder Cancer Neg Hx    Kidney cancer Neg Hx     Social History   Socioeconomic History   Marital status: Single    Spouse name: Not on file   Number of children: Not on file   Years of education: Not on file   Highest education level: Not on file  Occupational History   Not on file  Tobacco Use   Smoking status: Never   Smokeless tobacco: Never  Substance and Sexual Activity   Alcohol use: Yes    Comment: occasionally   Drug use: Not on file   Sexual activity: Not on file  Other Topics Concern   Not on file  Social History Narrative   Not on file   Social Drivers of Health   Tobacco Use: Not on file  Financial Resource Strain: Not on file  Food Insecurity: Not on file  Transportation Needs: Not on file  Physical Activity: Not on file  Stress: Not on file  Social Connections: Not on file  Intimate Partner Violence: Not on file  Depression (EYV7-0): Not on file  Alcohol Screen: Not on file  Housing: Not on file  Utilities: Not on file  Health Literacy: Not on file     Constitutional: Patient reports chills, headache.  Denies fever, malaise, fatigue, or abrupt weight changes.  HEENT: Pt reports sinus pressure, runny nose, nasal congestion, and  sore throat. Denies eye pain, eye redness, ear pain, ringing in the ears, wax buildup, bloody nose. Respiratory: Patient reports cough.  Denies difficulty breathing, shortness of breath.   Cardiovascular: Denies chest pain, chest tightness, palpitations or swelling in the hands or feet.  Gastrointestinal: Patient reports vomiting and diarrhea.  Denies abdominal pain, bloating, constipation, or blood in the stool.  Musculoskeletal: Denies decrease in range of motion, difficulty with gait, muscle pain or joint pain and swelling.  Skin: Denies redness, rashes, lesions or  ulcercations.  Neurological: Denies dizziness, difficulty with memory, difficulty with speech or problems with balance and coordination.    No other specific complaints in a complete review of systems (except as listed in HPI above).      Objective:   Physical Exam  BP 118/74 (BP Location: Left Arm, Patient Position: Sitting, Cuff Size: Large)   Pulse 66   Temp 98.7 F (37.1 C)   Ht 5' 5 (1.651 m)   Wt 202 lb 6.4 oz (91.8 kg)   SpO2 99%   BMI 33.68 kg/m   Wt Readings from Last 3 Encounters:  02/02/17 231 lb 14.4 oz (105.2 kg)  01/26/17 232 lb (105.2 kg)  01/23/17 235 lb 9.6 oz (106.9 kg)    General: Appears his stated age, obese, in NAD. Skin: Warm, dry and intact. No rashes, lesions or ulcerations noted. HEENT: Head: normal shape and size, no sinus tenderness noted; Eyes: sclera white, no icterus, conjunctiva pink, PERRLA and EOMs intact; Ears: Tm's gray and intact, normal light reflex; Nose: mucosa pink and moist, septum midline; Throat/Mouth: Teeth present, mucosa pink and moist, + PND, no exudate, lesions or ulcerations noted.  Neck: No adenopathy noted. Cardiovascular: Normal rate and rhythm. S1,S2 noted.  No murmur, rubs or gallops noted.  Pulmonary/Chest: Normal effort and positive vesicular breath sounds. No respiratory distress. No wheezes, rales or ronchi noted.  Abdomen: Soft and nontender. Normal bowel sounds.  Musculoskeletal: Webbed neck noted.  No difficulty with gait.  Neurological: Alert and oriented. Coordination normal.  Psychiatric: Mood and affect normal. Behavior is normal. Judgment and thought content normal.    BMET    Component Value Date/Time   NA 135 01/22/2017 2017   K 3.7 01/22/2017 2017   CL 100 (L) 01/22/2017 2017   CO2 25 01/22/2017 2017   GLUCOSE 117 (H) 01/22/2017 2017   BUN 12 01/22/2017 2017   CREATININE 0.65 01/22/2017 2017   CALCIUM 8.6 (L) 01/22/2017 2017   GFRNONAA >60 01/22/2017 2017   GFRAA >60 01/22/2017 2017    Lipid  Panel  No results found for: CHOL, TRIG, HDL, CHOLHDL, VLDL, LDLCALC  CBC    Component Value Date/Time   WBC 8.6 01/22/2017 2017   RBC 4.55 01/22/2017 2017   HGB 11.8 (L) 01/22/2017 2017   HCT 34.3 (L) 01/22/2017 2017   PLT 201 01/22/2017 2017   MCV 75.5 (L) 01/22/2017 2017   MCH 25.9 (L) 01/22/2017 2017   MCHC 34.3 01/22/2017 2017   RDW 14.9 (H) 01/22/2017 2017   LYMPHSABS 0.9 (L) 01/22/2017 2017   MONOABS 0.8 01/22/2017 2017   EOSABS 0.0 01/22/2017 2017   BASOSABS 0.1 01/22/2017 2017    Hgb A1C No results found for: HGBA1C          Assessment & Plan:  Assessment and Plan    Urgent care follow-up for acute upper respiratory infection Urgent care notes and labs reviewed. Symptoms suggest viral etiology; antibiotics considered due to symptom duration. - Prescribed azithromycin  250 mg daily for 5 days. - Prescribed prednisone 10 mg taper for 6 days. - Prescribed Tessalon 100 mg cough tablets every 8 hours as needed. - Ordered CBC, CMP, and mono spot blood tests.   RTC in 6 months for your annual exam Angeline Laura, NP     [1] No Known Allergies  "

## 2024-05-16 NOTE — Assessment & Plan Note (Signed)
 Continue gammagard 50 mg weekly as previously prescribed Referral to allergist placed

## 2024-05-17 ENCOUNTER — Ambulatory Visit: Payer: Self-pay | Admitting: Internal Medicine

## 2024-05-17 LAB — CBC
HCT: 43.3 % (ref 39.4–51.1)
Hemoglobin: 13.7 g/dL (ref 13.2–17.1)
MCH: 24.9 pg — ABNORMAL LOW (ref 27.0–33.0)
MCHC: 31.6 g/dL (ref 31.6–35.4)
MCV: 78.7 fL — ABNORMAL LOW (ref 81.4–101.7)
MPV: 11.2 fL (ref 7.5–12.5)
Platelets: 166 Thousand/uL (ref 140–400)
RBC: 5.5 Million/uL (ref 4.20–5.80)
RDW: 14.2 % (ref 11.0–15.0)
WBC: 5.2 Thousand/uL (ref 3.8–10.8)

## 2024-05-17 LAB — COMPREHENSIVE METABOLIC PANEL WITH GFR
AG Ratio: 2.2 (calc) (ref 1.0–2.5)
ALT: 33 U/L (ref 9–46)
AST: 20 U/L (ref 10–40)
Albumin: 4.1 g/dL (ref 3.6–5.1)
Alkaline phosphatase (APISO): 68 U/L (ref 36–130)
BUN: 9 mg/dL (ref 7–25)
CO2: 30 mmol/L (ref 20–32)
Calcium: 9.1 mg/dL (ref 8.6–10.3)
Chloride: 101 mmol/L (ref 98–110)
Creat: 0.62 mg/dL (ref 0.60–1.24)
Globulin: 1.9 g/dL (ref 1.9–3.7)
Glucose, Bld: 87 mg/dL (ref 65–99)
Potassium: 4 mmol/L (ref 3.5–5.3)
Sodium: 140 mmol/L (ref 135–146)
Total Bilirubin: 0.3 mg/dL (ref 0.2–1.2)
Total Protein: 6 g/dL — ABNORMAL LOW (ref 6.1–8.1)
eGFR: 133 mL/min/1.73m2

## 2024-05-17 LAB — MONONUCLEOSIS SCREEN: Heterophile, Mono Screen: NEGATIVE

## 2024-05-17 NOTE — Telephone Encounter (Signed)
 Please call patient.  The allergist in [place] did not accept him as a patient for management of his agammaglobulinemia.  He recommended that he needs to stay at a teaching facility.  We would recommend that he stay with [facility] allergy at this time.

## 2024-05-25 NOTE — Progress Notes (Signed)
 [facility] at [facility] [address] [place], [place] [address]  Assessment and Plan:  Greg Valdez is a 29 y.o. 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   Assessment & Plan Common variable immunodeficiency Currently stable, recent lapse in subcutaneous immunoglobulin therapy 2/2 [employer] not covered by [facility], now in agreement and able to get care. He has been off therapy for six weeks and in this time contracted URI and viral gastroenteritis requiring course of Azithromycin  x 5 days and Prednisone  x 6 days. He denies any other reports of infections since last evaluation when he was consistently using weekly immunoglobulin, no reports of abscess formation since 2021. Last IgG level was 820 mg/dL on 88/08/7973. Switched from [place] to Hizentra in May 2023 due to insurance preferences. - Repeated immune work-up today, including total IgG which is likely below goal 2/2 ~6 weeks without therapy. - Reordered Hizentra therapy, range 9g-14g per week with new weight of 201lbs, recommend 14g given recent recurrent infections off therapy. - Ordered repeat IgG level in four months after restarting immunoglobulin therapy. - Educated on using MyChart for non-urgent medical questions and scheduling appointments. - Continue as-needed use of Flonase nasal spray with 1 spray each nare twice daily during acute rhinitis symptoms, may increase to 2 sprays twice daily. - Reminded of use of Mucinex 600-1200mg  PO BID and Aureliano med sinus irrigations BID during acute URI symptoms  Return in about 1 year (around 05/25/2025)  for In-person or Video Visit.  I personally spent 30 minutes face-to-face and non-face-to-face in the care of this patient, which includes all pre, intra, and post visit time on the date of service.  All documented time was specific to the E/M visit and does not include any procedures that may have been performed.  Subjective:  Last clinic visit: 03/24/2023 with this provider for the following:  Agammaglobulinemia (CMS-HCC) Well controlled on Gammagard 15g subcutaneous Qweekly, tolerating well without SE or site irritation. Pt. reports occasional sinus symptoms for which he used PRN Bactrim  DS he had left over from prior RX, recommended appropriate nasal hygiene with Flonase 2 sprays twice daily, neil med sinus irrigations BID prior to use of Flonase, and Mucinex 1200mg  PO BID to prevent infection. Will repeat total IgG, CBC, and CMP today, recommended vaccination however pt. Prefers to not take Flu and COVID vaccinations.   Opacity of lung on imaging study Hx of abnormal chest CT with biopsies lost to follow-up by Hem/Onc, reports dry intermittent cough but otherwise asymptomatic, placed referral to Pulmonary, refilled Albuterol with spacer to use 1-2 puffs Q4-6H PRN  INTERVAL HISTORY OF PRESENT ILLNESS: Greg Valdez is a 29 y.o. 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   History of Present Illness Greg Valdez is a 29 year old male with common variable immunodeficiency (CVID) who presents for follow-up after a six-week lapse in Hizentra therapy due to insurance issues.  He experienced a six-week lapse in Hizentra therapy  due to insurance complications with [employer], which required him to cancel a December appointment. He has been in contact with [facility], who informed him that no further medication would be released until he was seen by his doctor. He was previously on GammaGard but switched to Hizentra in May 2023 due to insurance preferences.  Around Christmas, he experienced a gastrointestinal illness with symptoms of nausea, vomiting, and diarrhea. He was the first in his family to get it, although his mother and sister had it earlier. The illness lasted a couple of days, peaking on Christmas Day, during which he stayed in bed all day. He did not check his temperature but felt he might have had a fever. Review of UC notes reveals pt. Also reported cough and nasal congestion for week with mild wheezing, COVID/flu/RSV and Monospot negative. He was advised conservative measures. He returned to his PCP on 05/16/2024 for ongoing symptoms at which point he was given Azithromycin  250mg  PO every day x 5 days, Prednisone  taper x 6 days, and Tessalon  pearls for cough.  He has a history of MRSA abscesses, with the last occurrence over a year ago. He had an abdominal wall abscess in 2021, which required hospitalization and drainage. He has not had any abscesses since this hospitalization.  He reports significant weight loss from 240 pounds to 195 pounds, which he attributes to increased physical activity and decreased appetite during the summer. His weight today is 201 pounds.  He has not had any new medications or diagnoses and has not been sick this year apart from the stomach bug. He does not wear a mask but practices good hand hygiene. He has not had any other reports of use of antibiotics for bronchitis, sinusitis, or otitis media in over a year and uses Flonase nasal spray as needed.  His last tetanus vaccine was in 2018, and he has not received COVID or flu vaccines. His pneumococcal levels were previously low, and his  IgG levels have historically been in the 700-800 range, with a low of less than 135 in 2019 before starting therapy.  ROS: Pertinent positive and negatives included in the above HPI. All other systems reviewed are negative.  Past Medical History[1]  Past Surgical History[2]  Current Medications[3]  Allergies[4]  Family History[5]  SOCIAL AND ENVIRONMENTAL HISTORY: Reviewed and no change from previous.   Tobacco Use History[6] Social History   Substance and Sexual Activity  Alcohol Use Yes   Comment: socially   Social History   Substance and Sexual Activity  Drug Use Never   Objective:  PE:  Vitals:   05/25/24 0757  BP: 121/78  BP Site: L Arm  BP Position: Sitting  BP Cuff Size: Large  Pulse: 63  SpO2: 99%  Weight: 91.4 kg (201 lb 6.4 oz)   Body mass index is 33.51 kg/m.  General:  Well nourished male in no acute distress without toxic appearance. Skin:  No eczematous patches or rash. No dermatographism. HEENT:  No conjunctival injection, allergic shiners and prominent infraorbital folds; tympanic membranes are translucent with normal landmarks and without effusion;   Anterior nasal examination: pink nasal mucosa, non-edematous inferior turbinates, no nasal septal deviation or visible polyps; granular oropharynx without exudates, ulcerations, or thrush; no frontal or maxillary sinus tenderness Neck:  Supple with FROM. No thyromegaly. CV: Regular rhythm; normal S1 and S2; no murmur, gallop or rub. Respiratory:  Adequate inspiratory effort. Clear to auscultation bilaterally. No wheezes,  crackles, or rhonchi. Gastrointestinal:  Soft, nontender, no hepatosplenomegaly, no masses.  Hematologic/Lymphatics: No cervical, supraclavicular, submandibular, or preauricular lymphadenopathy. No abnormal bruising. Neurologic:  Alert and mental status appropriate for age; normal gait. Musculoskeletal:  No peripheral edema. Normal bulk for age Psychiatric: Relaxed, friendly, and  cooperative with interview and examination. Euthymic affect. Normal thought process and thought content.  Laboratory testing reviewed and pertinent for the following: CBC Order: [id number] Component Ref Range & Units 05/16/24 0859  WBC 3.8 - 10.8 Thousand/uL 5.2  RBC 4.20 - 5.80 Million/uL 5.50  Hemoglobin 13.2 - 17.1 g/dL 86.2  HCT 60.5 - 48.8 % 43.3  MCV 81.4 - 101.7 fL 78.7 Low   MCH 27.0 - 33.0 pg 24.9 Low   MCHC 31.6 - 35.4 g/dL 68.3  RDW 88.9 - 84.9 % 14.2  Platelets 140 - 400 Thousand/uL 166  MPV 7.5 - 12.5 fL 11.2   Ref Range & Units 05/16/24 0859  Glucose, Bld 65 - 99 mg/dL 87  Comment: .            Fasting reference interval .  BUN 7 - 25 mg/dL 9  Creat 9.39 - 8.75 mg/dL 9.37  eGFR > OR = 60 fO/fpw/8.26f7 133  BUN/Creatinine Ratio 6 - 22 (calc) SEE NOTE:  Comment:    Not Reported: BUN and Creatinine are within    reference range. .  Sodium 135 - 146 mmol/L 140  Potassium 3.5 - 5.3 mmol/L 4.0  Chloride 98 - 110 mmol/L 101  CO2 20 - 32 mmol/L 30  Calcium 8.6 - 10.3 mg/dL 9.1  Total Protein 6.1 - 8.1 g/dL 6.0 Low   Albumin 3.6 - 5.1 g/dL 4.1  Globulin 1.9 - 3.7 g/dL (calc) 1.9  AG Ratio 1.0 - 2.5 (calc) 2.2  Total Bilirubin 0.2 - 1.2 mg/dL 0.3  Alkaline phosphatase (APISO) 36 - 130 U/L 68  AST 10 - 40 U/L 20  ALT 9 - 46 U/L 33     View Follow-Up Encounter <redacted file path>            Component Ref Range & Units (hover) 03/24/23 1315 04/29/22 1428 08/07/21 1352 10/24/19 0905 01/21/19 1148 01/21/18 1426   Total IgG 820 890 <redacted file path> 780 <redacted file path> R 593 <redacted file path> Low <redacted file path>  R 556 <redacted file path> Low <redacted file path>  R <135 <redacted file path> Low <redacted file path>  R      Amber C. Rafferty MSN, AGNP-BC, FNP-BC, ENP-BC, [facility]: Division of Allergy and Immunology       [1] No past medical history on file. [2] Past Surgical History: Procedure Laterality  Date   OTHER SURGICAL HISTORY Right 2014   groin biopsy   PR BRNCHSC EBUS GUIDED SAMPL 3/> NODE STATION/STRUX N/A 07/26/2021   Procedure: BRONCH, RIGID OR FLEXIBLE, INCLUDING FLUORO GUIDANCE, WHEN PERFORMED; W EBUS GUIDED TRANSTRACHEAL AND/OR TRANSBRONCHIAL SAMPLING, 3 OR MORE MEDIASTINAL AND/OR HILAR LYMPH NODE STATIONS OR STRUCTURES;  Surgeon: Dalton Renny Dancer, MD;  Location: MAIN OR [facility];  Service: Pulmonary   PR BRONCHOSCOPY,BIOPSY N/A 07/26/2021   Procedure: BRONCHOSCOPY, RIGID/FLEXIBLE, INCLUDE FLUORO GUIDE WHEN PERFORMED; W/BRONCH/ENDOBRONCH BX, SINGLE/MULT SITE;  Surgeon: Dalton Renny Dancer, MD;  Location: MAIN OR [facility];  Service: Pulmonary   PR BRONCHOSCOPY,DIAGNOSTIC W LAVAGE N/A 07/26/2021   Procedure: BRONCHOSCOPY, RIGID OR FLEXIBLE, INCLUDE FLUOROSCOPIC GUIDANCE WHEN PERFORMED; W/BRONCHIAL ALVEOLAR LAVAGE;  Surgeon: Dalton Renny Dancer, MD;  Location: MAIN OR [facility];  Service: Pulmonary  [3]  Current Outpatient Medications  Medication Sig Dispense Refill   albuterol HFA 90 mcg/actuation inhaler Inhale 2 puffs every six (6) hours as needed for wheezing. 6.7 g 11   cyclobenzaprine (FLEXERIL) 10 MG tablet Take 0.5-1 tablets (5-10 mg total) by mouth Three (3) times a day as needed for muscle spasms. 30 tablet 1   fluticasone propionate (FLONASE) 50 mcg/actuation nasal spray 2 sprays into each nostril daily. 6 g 12   immune globulin ,gamma,IgG, (IMMUNE GLOBULIN , HUMAN,, IGG, SUBQ) Inject 15 g under the skin once a week. Gammagard 15 grams once weekly on Wednesday     ondansetron  (ZOFRAN -ODT) 4 MG disintegrating tablet Take 1 tablet (4 mg total) by mouth every eight (8) hours as needed for nausea. 15 tablet 0   sulfamethoxazole -trimethoprim  (BACTRIM  DS) 800-160 mg per tablet Take 2 tablets twice daily for 1-2 weeks as needed for flares 60 tablet 2   No current facility-administered medications for this visit.  [4] Allergies Allergen Reactions   Vancomycin Analogues  Anaphylaxis    Red man syndrome and throat swelling   Sulfa  (Sulfonamide Antibiotics)     Pt mother reports he is allergic to sulfa  drugs, but can tolerate bactrim .   [5] Family History Problem Relation Age of Onset   Melanoma Neg Hx    Basal cell carcinoma Neg Hx    Squamous cell carcinoma Neg Hx   [6] Social History Tobacco Use  Smoking Status Never   Passive exposure: Never  Smokeless Tobacco Current   Types: Snuff  Tobacco Comments   pt tobacco-free for 4 weeks

## 2024-11-21 ENCOUNTER — Encounter: Payer: Self-pay | Admitting: Internal Medicine
